# Patient Record
Sex: Female | Born: 2000 | Race: White | Hispanic: No | Marital: Single | Smoking: Former smoker
Health system: Southern US, Community
[De-identification: ages and names within clinical notes are randomized; demographics above are authoritative.]

## PROBLEM LIST (undated history)

## (undated) ENCOUNTER — Emergency Department (HOSPITAL_COMMUNITY): Admission: EM | Payer: Self-pay | Source: Home / Self Care

## (undated) DIAGNOSIS — F419 Anxiety disorder, unspecified: Secondary | ICD-10-CM

## (undated) DIAGNOSIS — F32A Depression, unspecified: Secondary | ICD-10-CM

## (undated) HISTORY — PX: WISDOM TOOTH EXTRACTION: SHX21

## (undated) HISTORY — DX: Depression, unspecified: F32.A

## (undated) HISTORY — DX: Anxiety disorder, unspecified: F41.9

## (undated) HISTORY — PX: TONSILLECTOMY: SUR1361

---
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
## 2021-11-15 ENCOUNTER — Ambulatory Visit: Payer: Self-pay | Admitting: Family Medicine

## 2021-11-28 NOTE — Progress Notes (Deleted)
Medical Nutrition Therapy ?Patient has completed 2nd dose of COVID-19 vaccine ***. ?Appt start time: 1430 end time: 1530 (1 hour) ?Primary concerns today:  Disordered eating beahaviors .  ? ?Relevant history/background: Megan Zamora was referred by Deforest Hoyles.   ? ?Assessment:  *** ? ?Learning Readiness:  ?Not ready ?Contemplating ?Ready ?Change in progress ? ?Usual eating pattern: *** meals and *** snacks per day. ?Frequent foods and beverages: ***.   ?Avoided foods: ***.   ?Usual physical activity: ***. ?Sleep: *** ? ?24-hr recall: ?(Up at  AM) ?B ( AM)-    ?Snk ( AM)-    ?L ( PM)-   ?Snk ( PM)-   ?D ( PM)-   ?Snk ( PM)-   ?Typical day? {yes no:314532}   ? ?Nutritional Diagnosis:  ?{CHL AMB NUTRITIONAL DIAGNOSIS:[id number]} ? ?Handouts given during visit include: ?After-Visit Summary (AVS) ?*** ? ?Demonstrated degree of understanding via:  Teach Back  ?Barriers to learning/adherence to lifestyle change: *** ? ?Monitoring/Evaluation:  Dietary intake, exercise, and body weight {follow up:15908}. ? ?

## 2021-11-29 ENCOUNTER — Ambulatory Visit: Payer: Self-pay | Admitting: Family Medicine

## 2022-10-15 ENCOUNTER — Ambulatory Visit (INDEPENDENT_AMBULATORY_CARE_PROVIDER_SITE_OTHER): Payer: Commercial Managed Care - PPO | Admitting: Physician Assistant

## 2022-10-15 ENCOUNTER — Encounter: Payer: Self-pay | Admitting: Physician Assistant

## 2022-10-15 VITALS — BP 127/81 | HR 84 | Ht 65.0 in | Wt 120.0 lb

## 2022-10-15 DIAGNOSIS — Z79899 Other long term (current) drug therapy: Secondary | ICD-10-CM

## 2022-10-15 DIAGNOSIS — F3162 Bipolar disorder, current episode mixed, moderate: Secondary | ICD-10-CM

## 2022-10-15 MED ORDER — LURASIDONE HCL 20 MG PO TABS: 20.0000 mg | ORAL_TABLET | Freq: Every day | ORAL | 1 refills | Status: DC

## 2022-10-15 NOTE — Patient Instructions (Signed)
Start taking a multivitamin, B Complex, Vitamin D 2000 IU daily and fish oil
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
## 2022-11-13 LAB — HEMOGLOBIN A1C
Est. average glucose Bld gHb Est-mCnc: 100 mg/dL
Hgb A1c MFr Bld: 5.1 % (ref 4.8–5.6)

## 2022-11-13 LAB — COMPREHENSIVE METABOLIC PANEL
ALT: 14 IU/L (ref 0–32)
AST: 15 IU/L (ref 0–40)
Albumin/Globulin Ratio: 1.9 (ref 1.2–2.2)
Albumin: 4.5 g/dL (ref 4.0–5.0)
Alkaline Phosphatase: 64 IU/L (ref 44–121)
BUN/Creatinine Ratio: 14 (ref 9–23)
BUN: 9 mg/dL (ref 6–20)
Bilirubin Total: 0.3 mg/dL (ref 0.0–1.2)
CO2: 22 mmol/L (ref 20–29)
Calcium: 9.3 mg/dL (ref 8.7–10.2)
Chloride: 103 mmol/L (ref 96–106)
Creatinine, Ser: 0.63 mg/dL (ref 0.57–1.00)
Globulin, Total: 2.4 g/dL (ref 1.5–4.5)
Glucose: 78 mg/dL (ref 70–99)
Potassium: 3.5 mmol/L (ref 3.5–5.2)
Sodium: 139 mmol/L (ref 134–144)
Total Protein: 6.9 g/dL (ref 6.0–8.5)
eGFR: 129 mL/min/{1.73_m2} (ref >59)

## 2022-11-13 LAB — LIPID PANEL
Chol/HDL Ratio: 1.8 ratio (ref 0.0–4.4)
Cholesterol, Total: 143 mg/dL (ref 100–199)
HDL: 81 mg/dL (ref >39)
LDL Chol Calc (NIH): 54 mg/dL (ref 0–99)
Triglycerides: 30 mg/dL (ref 0–149)
VLDL Cholesterol Cal: 8 mg/dL (ref 5–40)

## 2022-11-27 ENCOUNTER — Ambulatory Visit: Payer: Self-pay | Admitting: Physician Assistant

## 2022-12-25 ENCOUNTER — Ambulatory Visit (INDEPENDENT_AMBULATORY_CARE_PROVIDER_SITE_OTHER): Payer: Self-pay | Admitting: Physician Assistant

## 2022-12-25 ENCOUNTER — Encounter: Payer: Self-pay | Admitting: Physician Assistant

## 2022-12-25 ENCOUNTER — Ambulatory Visit: Payer: Self-pay | Admitting: Physician Assistant

## 2022-12-25 DIAGNOSIS — F3162 Bipolar disorder, current episode mixed, moderate: Secondary | ICD-10-CM

## 2022-12-25 MED ORDER — LURASIDONE HCL 40 MG PO TABS: 40.0000 mg | ORAL_TABLET | Freq: Every day | ORAL | 1 refills | Status: DC

## 2022-12-25 NOTE — Progress Notes (Signed)
[facility] Med Check  Patient ID: Megan Zamora,  MRN: [id number]  PCP: Pcp, No  Date of Evaluation: 12/25/2022 Time spent:20 minutes  Chief Complaint:  Chief Complaint   Follow-up    HISTORY/CURRENT STATUS: HPI For routine med check  Started Latuda 2 months ago. Still has mood swings but the manic episodes are not as intense as they were.  She still feels kind of down, is sensitive easily and can cry easily for no reason.  There are times when she lacks energy but it is not affecting her at work or at home.  ADLs and personal hygiene are normal.  Appetite is normal.  She has gained a couple of pounds since being on the [place] but she does not see it as a problem as she is underweight anyway.  She is looking forward to going to [place] on a road trip leaving in 2 weeks.  She will be rock climbing, hiking, and spending time with friends.  Not having a lot of anxiety.  She sleeps well.  No suicidal or homicidal thoughts.  Patient denies increased energy with decreased need for sleep, increased talkativeness, racing thoughts, impulsivity or risky behaviors, increased spending, increased libido, grandiosity, increased irritability or anger, paranoia, or hallucinations.  Denies dizziness, syncope, seizures, numbness, tingling, tremor, tics, unsteady gait, slurred speech, confusion. Denies muscle or joint pain, stiffness, or dystonia. Denies unexplained weight loss, frequent infections, or sores that heal slowly.  No polyphagia, polydipsia, or polyuria. Denies visual changes or paresthesias.   Individual Medical History/ Review of Systems: Changes? :No   Past medications for mental health diagnoses include: Lexapro   Saw Psych at [place] [facility]   No Suicide attempts, H/O cutting, last time was 07/2022.  Has not had to seek medical attention for any of the lacerations.   Sees Carney Bern since she was 22 years old  Allergies: Patient has no known  allergies.  Current Medications:  Current Outpatient Medications:    b complex vitamins capsule, Take 1 capsule by mouth daily., Disp: , Rfl:    lurasidone (LATUDA) 40 MG TABS tablet, Take 1 tablet (40 mg total) by mouth daily with supper., Disp: 30 tablet, Rfl: 1   valACYclovir (VALTREX) 500 MG tablet, Take 500 mg by mouth 2 (two) times daily. prn, Disp: , Rfl:  Medication Side Effects: none  Family Medical/ Social History: Changes? No  MENTAL HEALTH EXAM:  There were no vitals taken for this visit.There is no height or weight on file to calculate BMI.  General Appearance: Casual and Well Groomed  Eye Contact:  Good  Speech:  Clear and Coherent and Normal Rate  Volume:  Normal  Mood:  Euthymic  Affect:  Congruent  Thought Process:  Goal Directed and Descriptions of Associations: Circumstantial  Orientation:  Full (Time, Place, and Person)  Thought Content: Logical   Suicidal Thoughts:  No  Homicidal Thoughts:  No  Memory:  WNL  Judgement:  Good  Insight:  Good  Psychomotor Activity:  Normal  Concentration:  Concentration: Good  Recall:  Good  Fund of Knowledge: Good  Language: Good  Assets:  Desire for Improvement Financial Resources/Insurance Housing Transportation Vocational/Educational  ADL's:  Intact  Cognition: WNL  Prognosis:  Good   Labs 11/12/2022 CMP glucose 78, all other values normal Total cholesterol 143, triglycerides 30, HDL 81, LDL 54 Hemoglobin A1c 5.1  DIAGNOSES:    ICD-10-CM   1. Bipolar 1 disorder, mixed, moderate (HCC)  F31.62  Receiving Psychotherapy: Yes with Carney Bern.  RECOMMENDATIONS:  PDMP reviewed.  Tramadol given 07/24/2021. I provided 20 minutes of face to face time during this encounter, including time spent before and after the visit in records review, medical decision making, counseling pertinent to today's visit, and charting.   She is responding to the [facility] but the dose is too low so will increase  that.  Increase Latuda to 40 mg, 1 p.o. q. evening with supper. Continue B complex. Continue therapy with Carney Bern. Return in 6 weeks.  Donnal Moat, PA-C

## 2023-01-06 ENCOUNTER — Telehealth: Payer: Self-pay | Admitting: Physician Assistant

## 2023-01-06 NOTE — Telephone Encounter (Signed)
Megan Zamora called at 2:00 to request a call back.  She is having issues with her Latuda and would like to discuss the medication.  Next appt 5/7

## 2023-01-07 ENCOUNTER — Other Ambulatory Visit: Payer: Self-pay | Admitting: Physician Assistant

## 2023-01-07 NOTE — Telephone Encounter (Signed)
Patient called to say that when she increased the Latuda to 40 mg she began having visual hallucinations, seeing things that weren't there. She also reports "dizziness" that feels like things are spinning. She reports that mood swings seem better though. Last visit was 3/27, which is when the Latuda was increased.   Pharmacy is [facility] on [address].

## 2023-01-08 ENCOUNTER — Other Ambulatory Visit: Payer: Self-pay | Admitting: Physician Assistant

## 2023-01-08 MED ORDER — LURASIDONE HCL 20 MG PO TABS: 20.0000 mg | ORAL_TABLET | Freq: Every day | ORAL | 1 refills | Status: DC

## 2023-01-08 NOTE — Telephone Encounter (Signed)
LVM to RC 

## 2023-01-08 NOTE — Telephone Encounter (Signed)
Patient notified of Rx and recommendations.  

## 2023-01-08 NOTE — Telephone Encounter (Signed)
I think she's leaving soon to go on a road trip to [place] to rock climb. I don't want to make major changes now. (Like trying something completely new.)  Let's go back to 20 mg on the Latuda, and we'll discuss further.at our next visit.  I sent in a prescription for Latuda 20 mg.

## 2023-02-04 ENCOUNTER — Ambulatory Visit (INDEPENDENT_AMBULATORY_CARE_PROVIDER_SITE_OTHER): Payer: Commercial Managed Care - PPO | Admitting: Physician Assistant

## 2023-02-04 ENCOUNTER — Encounter: Payer: Self-pay | Admitting: Physician Assistant

## 2023-02-04 DIAGNOSIS — F3162 Bipolar disorder, current episode mixed, moderate: Secondary | ICD-10-CM | POA: Diagnosis not present

## 2023-02-04 DIAGNOSIS — R4584 Anhedonia: Secondary | ICD-10-CM | POA: Diagnosis not present

## 2023-02-04 MED ORDER — LURASIDONE HCL 20 MG PO TABS: 20.0000 mg | ORAL_TABLET | Freq: Every day | ORAL | 1 refills | Status: DC

## 2023-02-04 NOTE — Progress Notes (Signed)
[facility] Med Check  Patient ID: Laneisha Holderbaum,  MRN: [id number]  PCP: Pcp, No  Date of Evaluation: 02/04/2023 Time spent:20 minutes  Chief Complaint:  Chief Complaint   Follow-up    HISTORY/CURRENT STATUS: HPI For routine med check, 15 mins late for appt.  We increased Latuda to 40 mg about 6 weeks ago.  She states it made her feel bad, caused nausea and dizziness so she stopped it altogether.  States she just had her menstrual cycle and is not pregnant.  Prior to that she had been on 20 mg and had responded well.  States she also felt kind of "numb" and did not want to do anything that she normally liked.  She felt sad, did not cry easily though.  After she stopped the 40 mg of Latuda her mood went back to normal, the dizziness and nausea went away.  Appetite and weight are stable.  No laxative use, binging or purging, or calorie restricting.  No self-harm.  Personal hygiene is normal.  Work is fine.  Denies anxiety.  No suicidal or homicidal thoughts.  Patient denies increased energy with decreased need for sleep, increased talkativeness, racing thoughts, impulsivity or risky behaviors, increased spending, increased libido, grandiosity, increased irritability or anger, paranoia, or hallucinations.  Denies dizziness, syncope, seizures, numbness, tingling, tremor, tics, unsteady gait, slurred speech, confusion. Denies muscle or joint pain, stiffness, or dystonia. Denies unexplained weight loss, frequent infections, or sores that heal slowly.  No polyphagia, polydipsia, or polyuria. Denies visual changes or paresthesias.   Individual Medical History/ Review of Systems: Changes? :No   Past medications for mental health diagnoses include: Lexapro   [facility]   No Suicide attempts, H/O cutting, last time was 07/2022.  Has not had to seek medical attention for any of the lacerations.   Sees Burney Gauze since she was 22 years old  Allergies:  Patient has no known allergies.  Current Medications:  Current Outpatient Medications:    b complex vitamins capsule, Take 1 capsule by mouth daily., Disp: , Rfl:    valACYclovir (VALTREX) 500 MG tablet, Take 500 mg by mouth 2 (two) times daily. prn, Disp: , Rfl:    lurasidone (LATUDA) 20 MG TABS tablet, Take 1 tablet (20 mg total) by mouth daily with supper., Disp: 30 tablet, Rfl: 1 Medication Side Effects: none  Family Medical/ Social History: Changes? No  MENTAL HEALTH EXAM:  There were no vitals taken for this visit.There is no height or weight on file to calculate BMI.  General Appearance: Casual and Well Groomed  Eye Contact:  Good  Speech:  Clear and Coherent and Normal Rate  Volume:  Normal  Mood:  Euthymic  Affect:  Congruent  Thought Process:  Goal Directed and Descriptions of Associations: Circumstantial  Orientation:  Full (Time, Place, and Person)  Thought Content: Logical   Suicidal Thoughts:  No  Homicidal Thoughts:  No  Memory:  WNL  Judgement:  Good  Insight:  Good  Psychomotor Activity:  Normal  Concentration:  Concentration: Good and Attention Span: Good  Recall:  Good  Fund of Knowledge: Good  Language: Good  Assets:  Desire for Improvement Financial Resources/Insurance Housing Transportation Vocational/Educational  ADL's:  Intact  Cognition: WNL  Prognosis:  Good   DIAGNOSES:    ICD-10-CM   1. Bipolar 1 disorder, mixed, moderate (HCC)  F31.62     2. Anhedonia  R45.84      Receiving Psychotherapy: Yes with  Burney Gauze.  RECOMMENDATIONS:  PDMP reviewed.  Tramadol given 07/24/2021. I provided 20 minutes of face to face time during this encounter, including time spent before and after the visit in records review, medical decision making, counseling pertinent to today's visit, and charting.   Discussed the dizziness and nausea.  Even though she just finished menses, if the dizziness and nausea recurs she needs to do a pregnancy test.  She  verbalizes understanding.   Reminded her that anhedonia is a symptom of depression, it is difficult to say if what she experienced was related to the Latuda or depression.  She tolerated the 20 mg however and did not experience anhedonia.  I recommend restarting that to help regulate symptoms of bipolar disorder.  She agrees.  Restart Latuda 20 mg, 1 p.o. q. evening with supper. Continue B complex.  Recommend vitamin D and multivitamin as well. Continue therapy with Burney Gauze. Return in 4-6 weeks.  Melony Overly, PA-C

## 2023-03-19 ENCOUNTER — Ambulatory Visit (INDEPENDENT_AMBULATORY_CARE_PROVIDER_SITE_OTHER): Payer: Self-pay | Admitting: Physician Assistant

## 2023-04-04 ENCOUNTER — Encounter (HOSPITAL_COMMUNITY): Payer: Self-pay

## 2023-04-04 ENCOUNTER — Ambulatory Visit (HOSPITAL_COMMUNITY)
Admission: EM | Admit: 2023-04-04 | Discharge: 2023-04-04 | Disposition: A | Discharge status: Home / Self Care | Payer: Commercial Managed Care - PPO | Attending: Emergency Medicine | Admitting: Emergency Medicine

## 2023-04-04 DIAGNOSIS — R0789 Other chest pain: Secondary | ICD-10-CM | POA: Insufficient documentation

## 2023-04-04 LAB — CBC WITH DIFFERENTIAL/PLATELET
Abs Immature Granulocytes: 0.04 10*3/uL (ref 0.00–0.07)
Basophils Absolute: 0.1 10*3/uL (ref 0.0–0.1)
Basophils Relative: 1 %
Eosinophils Absolute: 0.1 10*3/uL (ref 0.0–0.5)
Eosinophils Relative: 2 %
HCT: 41 % (ref 36.0–46.0)
Hemoglobin: 13.1 g/dL (ref 12.0–15.0)
Immature Granulocytes: 1 %
Lymphocytes Relative: 41 %
Lymphs Abs: 2.9 10*3/uL (ref 0.7–4.0)
MCH: 28.9 pg (ref 26.0–34.0)
MCHC: 32 g/dL (ref 30.0–36.0)
MCV: 90.5 fL (ref 80.0–100.0)
Monocytes Absolute: 0.7 10*3/uL (ref 0.1–1.0)
Monocytes Relative: 10 %
Neutro Abs: 3.2 10*3/uL (ref 1.7–7.7)
Neutrophils Relative %: 45 %
Platelets: 271 10*3/uL (ref 150–400)
RBC: 4.53 MIL/uL (ref 3.87–5.11)
RDW: 13 % (ref 11.5–15.5)
WBC: 7.1 10*3/uL (ref 4.0–10.5)
nRBC: 0 % (ref 0.0–0.2)

## 2023-04-04 LAB — COMPREHENSIVE METABOLIC PANEL
ALT: 13 U/L (ref 0–44)
AST: 21 U/L (ref 15–41)
Albumin: 4.1 g/dL (ref 3.5–5.0)
Alkaline Phosphatase: 66 U/L (ref 38–126)
Anion gap: 8 (ref 5–15)
BUN: 9 mg/dL (ref 6–20)
CO2: 26 mmol/L (ref 22–32)
Calcium: 9.3 mg/dL (ref 8.9–10.3)
Chloride: 107 mmol/L (ref 98–111)
Creatinine, Ser: 0.72 mg/dL (ref 0.44–1.00)
GFR, Estimated: >60 mL/min (ref >60)
Glucose, Bld: 50 mg/dL — ABNORMAL LOW (ref 70–99)
Potassium: 3.7 mmol/L (ref 3.5–5.1)
Sodium: 141 mmol/L (ref 135–145)
Total Bilirubin: 0.9 mg/dL (ref 0.3–1.2)
Total Protein: 6.9 g/dL (ref 6.5–8.1)

## 2023-04-04 NOTE — ED Triage Notes (Addendum)
No current chest pain. Sharp chest pain onset yesterday and lasted for 3 hours. Pain was in the left side of the chest into the right shoulder and mid chest.   Pain with movement of the left arm, Patient states during the chest pain episode could not mover or take a deep breath. HR was 130-140.   No h/o heart problems. Has a h/o muscle spasms but this felt different.   No medication changes. Patient states had some alcohol, no drugs. Had drank a lot of water too.

## 2023-04-04 NOTE — ED Provider Notes (Signed)
[facility]    CSN: [id number] Arrival date & time: 04/04/23  1307      History   Chief Complaint Chief Complaint  Patient presents with   Chest Pain    HPI Megan Zamora is a 22 y.o. adult.   Patient presents to clinic over concern for an episode of chest pain and tachycardia that started last night while she was eating at [employer].  Initially her chest pain was mild, and got worse over the span of a few hours.  Reports she felt shortness of breath and like she was really overheated so she sat in front of the [facility] unit.  Her boyfriend is an EMT and he checked her heart rate, it was in the 130s to the 140s.  Reports similar episode of something occurring about a year ago, this lasted for 45 minutes.  She did have 2 mixed drinks earlier in the day.  Denies smoking or any drug use.  She has a history of bipolar 1, anxiety and depression, came off Latuda in March and is not on any daily medications at this time.  No current chest pain, shortness of breath or tachycardia.    The history is provided by the patient and medical records.  Chest Pain Associated symptoms: shortness of breath     Past Medical History:  Diagnosis Date   Anxiety    Depression     There are no problems to display for this patient.   Past Surgical History:  Procedure Laterality Date   TONSILLECTOMY     WISDOM TOOTH EXTRACTION      OB History   No obstetric history on file.      Home Medications    Prior to Admission medications   Not on File    Family History Family History  Problem Relation Age of Onset   Bipolar disorder Mother    Anxiety disorder Mother    Alcohol abuse Father    Sleep apnea Father    Healthy Sister    Leukemia Maternal Grandfather    Heart disease Maternal Grandfather    Sleep apnea Maternal Grandmother    Hypertension Maternal Grandmother    Myelodysplastic syndrome Paternal Grandmother     Social History Social History   Tobacco Use    Smoking status: Every Day    Types: E-cigarettes   Smokeless tobacco: Never  Vaping Use   Vaping Use: Former  Substance Use Topics   Alcohol use: Yes    Alcohol/week: 1.0 standard drink of alcohol    Types: 1 Glasses of wine per week   Drug use: Yes    Frequency: 7.0 times per week    Types: Marijuana     Allergies   Patient has no known allergies.   Review of Systems Review of Systems  Respiratory:  Positive for shortness of breath.   Cardiovascular:  Positive for chest pain.     Physical Exam Triage Vital Signs ED Triage Vitals  Enc Vitals Group     BP 04/04/23 1342 (!) 93/56     Pulse Rate 04/04/23 1342 65     Resp 04/04/23 1342 16     Temp 04/04/23 1342 98.9 F (37.2 C)     Temp Source 04/04/23 1342 Oral     SpO2 04/04/23 1342 99 %     Weight 04/04/23 1341 120 lb (54.4 kg)     Height 04/04/23 1341 5\' 5"  (1.651 m)     Head Circumference --  Peak Flow --      Pain Score 04/04/23 1340 8     Pain Loc --      Pain Edu? --      Excl. in GC? --    No data found.  Updated Vital Signs BP (!) 93/56 (BP Location: Left Arm)   Pulse 65   Temp 98.9 F (37.2 C) (Oral)   Resp 16   Ht 5\' 5"  (1.651 m)   Wt 120 lb (54.4 kg)   LMP 02/27/2023 (Approximate)   SpO2 99%   BMI 19.97 kg/m   Visual Acuity Right Eye Distance:   Left Eye Distance:   Bilateral Distance:    Right Eye Near:   Left Eye Near:    Bilateral Near:     Physical Exam Vitals and nursing note reviewed.  Constitutional:      Appearance: She is well-developed.  HENT:     Head: Normocephalic and atraumatic.  Cardiovascular:     Rate and Rhythm: Normal rate and regular rhythm.     Heart sounds: Normal heart sounds. No murmur heard. Pulmonary:     Effort: Pulmonary effort is normal. No tachypnea.     Breath sounds: Normal breath sounds. No decreased breath sounds.  Skin:    General: Skin is warm and dry.  Neurological:     General: No focal deficit present.     Mental Status: She is  alert and oriented to person, place, and time.  Psychiatric:        Mood and Affect: Mood normal.      UC Treatments / Results  Labs (all labs ordered are listed, but only abnormal results are displayed) Labs Reviewed  COMPREHENSIVE METABOLIC PANEL  CBC WITH DIFFERENTIAL/PLATELET    EKG   Radiology No results found.  Procedures Procedures (including critical care time)  Medications Ordered in UC Medications - No data to display  Initial Impression / Assessment and Plan / UC Course  I have reviewed the triage vital signs and the nursing notes.  Pertinent labs & imaging results that were available during my care of the patient were reviewed by me and considered in my medical decision making (see chart for details).  Vitals and triage reviewed, patient is hemodynamically stable.  Without tachycardia on physical exam.  EKG shows normal sinus rhythm at a rate of 67 bpm, without ST elevation or ST depression.  No current chest pain or shortness of breath.  Unclear etiology to chest pain and tachycardia, could be dehydration, heat exposure, or other numerous causes.  Basic labs obtained in clinic, will contact if there is anything emergent.  Encouraged to follow-up with primary care provider for further evaluation.  Strict emergency room return precautions given if episode returns, patient verbalized understanding, no questions at this time.     Final Clinical Impressions(s) / UC Diagnoses   Final diagnoses:  Atypical chest pain     Discharge Instructions      Overall your physical exam is reassuring today.  Your EKG showed normal sinus rhythm at a rate of 67 bpm.  We are going to obtain some lab work to look for any electrolyte or metabolic abnormalities, we will contact you if there is anything emergent regarding this.  Please follow-up with your primary care provider for further evaluation and workup.    Please seek immediate care if you develop loss of consciousness,  continued elevated heart rate, or any new concerning symptoms.     ED Prescriptions  None    PDMP not reviewed this encounter.   Jon Lall, [place], [place] 04/04/23 [phone]

## 2023-04-04 NOTE — Discharge Instructions (Addendum)
Overall your physical exam is reassuring today.  Your EKG showed normal sinus rhythm at a rate of 67 bpm.  We are going to obtain some lab work to look for any electrolyte or metabolic abnormalities, we will contact you if there is anything emergent regarding this.  Please follow-up with your primary care provider for further evaluation and workup.    Please seek immediate care if you develop loss of consciousness, continued elevated heart rate, or any new concerning symptoms.

## 2023-04-18 ENCOUNTER — Ambulatory Visit (INDEPENDENT_AMBULATORY_CARE_PROVIDER_SITE_OTHER): Payer: Self-pay | Admitting: Physician Assistant

## 2023-04-18 DIAGNOSIS — Z91199 Patient's noncompliance with other medical treatment and regimen due to unspecified reason: Secondary | ICD-10-CM

## 2023-04-28 NOTE — Progress Notes (Signed)
No show

## 2023-05-21 ENCOUNTER — Ambulatory Visit: Payer: Commercial Managed Care - PPO | Admitting: Physician Assistant

## 2023-06-23 LAB — OB RESULTS CONSOLE RUBELLA ANTIBODY, IGM: Rubella: IMMUNE

## 2023-06-23 LAB — OB RESULTS CONSOLE GC/CHLAMYDIA
Chlamydia: NEGATIVE
Neisseria Gonorrhea: NEGATIVE

## 2023-06-23 LAB — OB RESULTS CONSOLE HIV ANTIBODY (ROUTINE TESTING): HIV: NONREACTIVE

## 2023-06-23 LAB — OB RESULTS CONSOLE HEPATITIS B SURFACE ANTIGEN: Hepatitis B Surface Ag: NEGATIVE

## 2023-06-23 LAB — OB RESULTS CONSOLE RPR: RPR: NONREACTIVE

## 2023-10-01 NOTE — L&D Delivery Note (Signed)
 Operative Delivery Note At 11:51 PM a viable female was delivered via Vaginal, Spontaneous.  Presentation: vertex; Position: Left,, Occiput,, Anterior; Station: +3. Patient pushed for almost 2 hours. Became exhausted. FHT with decreasing variability and decel with every contraction. Requested assistance with delivery. D/w pt options including continue to push, CS, or OVD. Elected OVD.   Verbal consent: obtained from patient.  Risks and benefits discussed in detail.  Risks include, but are not limited to the risks of anesthesia, bleeding, infection, damage to maternal tissues, fetal cephalhematoma.  There is also the risk of inability to effect vaginal delivery of the head, or shoulder dystocia that cannot be resolved by established maneuvers, leading to the need for emergency cesarean section.  Easy delivery of head followed by shoulders with gentle downward traction.  APGAR: 8, 9; weight pending .   Placenta status: routine, .   Cord:  with the following complications: .  Cord pH: not sent  Anesthesia:  CLE Instruments: Kiwi Episiotomy: None Lacerations: 2nd degree;Vaginal Suture Repair: 3.0 vicryl Est. Blood Loss (mL):  408cc   It's a boy - "Megan Zamora"!!  Mom to postpartum.  Baby to Couplet care / Skin to Skin.  Kendrick Pax Wileen Duncanson 02/04/2024, 12:16 AM

## 2023-10-30 ENCOUNTER — Telehealth: Payer: Self-pay | Admitting: Physician Assistant

## 2023-10-30 NOTE — Telephone Encounter (Signed)
LVM TO RC

## 2023-10-30 NOTE — Telephone Encounter (Signed)
Megan Zamora called stating that she is pregnant and needs a RX filled but says it can't be Lorsartin because it gave her side effects. Her phone number is [phone].  I've informed patient that she needs to make an appointment to discuss with Rosey Bath about the medication and being pregnant.

## 2023-10-31 NOTE — Telephone Encounter (Signed)
Please schedule pt an appt. To discuss meds and pregnancy.   Pt is not taking Latuda dt hallucinations.

## 2023-11-03 NOTE — Telephone Encounter (Signed)
 Lvm for pt to call and schedule

## 2023-11-13 LAB — OB RESULTS CONSOLE RPR: RPR: NONREACTIVE

## 2023-11-13 LAB — OB RESULTS CONSOLE HIV ANTIBODY (ROUTINE TESTING): HIV: NONREACTIVE

## 2023-11-13 LAB — OB RESULTS CONSOLE GC/CHLAMYDIA
Chlamydia: NEGATIVE
Neisseria Gonorrhea: NEGATIVE

## 2024-01-08 LAB — OB RESULTS CONSOLE GBS: GBS: NEGATIVE

## 2024-01-14 ENCOUNTER — Ambulatory Visit (INDEPENDENT_AMBULATORY_CARE_PROVIDER_SITE_OTHER): Payer: Commercial Managed Care - PPO | Admitting: Physician Assistant

## 2024-01-14 DIAGNOSIS — Z91199 Patient's noncompliance with other medical treatment and regimen due to unspecified reason: Secondary | ICD-10-CM

## 2024-01-14 NOTE — Progress Notes (Signed)
 No show

## 2024-02-03 ENCOUNTER — Encounter (HOSPITAL_COMMUNITY): Payer: Self-pay | Admitting: *Deleted

## 2024-02-03 ENCOUNTER — Inpatient Hospital Stay (HOSPITAL_COMMUNITY): Admitting: Anesthesiology

## 2024-02-03 ENCOUNTER — Inpatient Hospital Stay (HOSPITAL_COMMUNITY)
Admission: AD | Admit: 2024-02-03 | Discharge: 2024-02-05 | DRG: 807 | Disposition: A | Discharge status: Home / Self Care | Attending: Obstetrics and Gynecology | Admitting: Obstetrics and Gynecology

## 2024-02-03 DIAGNOSIS — O9902 Anemia complicating childbirth: Secondary | ICD-10-CM | POA: Diagnosis present

## 2024-02-03 DIAGNOSIS — Z87891 Personal history of nicotine dependence: Secondary | ICD-10-CM

## 2024-02-03 DIAGNOSIS — O26893 Other specified pregnancy related conditions, third trimester: Secondary | ICD-10-CM | POA: Diagnosis present

## 2024-02-03 DIAGNOSIS — L0292 Furuncle, unspecified: Secondary | ICD-10-CM | POA: Diagnosis present

## 2024-02-03 DIAGNOSIS — Z3490 Encounter for supervision of normal pregnancy, unspecified, unspecified trimester: Principal | ICD-10-CM

## 2024-02-03 DIAGNOSIS — O9832 Other infections with a predominantly sexual mode of transmission complicating childbirth: Secondary | ICD-10-CM | POA: Diagnosis present

## 2024-02-03 DIAGNOSIS — Z3A40 40 weeks gestation of pregnancy: Secondary | ICD-10-CM

## 2024-02-03 DIAGNOSIS — A6000 Herpesviral infection of urogenital system, unspecified: Secondary | ICD-10-CM | POA: Diagnosis present

## 2024-02-03 DIAGNOSIS — O9972 Diseases of the skin and subcutaneous tissue complicating childbirth: Secondary | ICD-10-CM | POA: Diagnosis present

## 2024-02-03 LAB — CBC
HCT: 37.4 % (ref 36.0–46.0)
Hemoglobin: 12 g/dL (ref 12.0–15.0)
MCH: 27 pg (ref 26.0–34.0)
MCHC: 32.1 g/dL (ref 30.0–36.0)
MCV: 84 fL (ref 80.0–100.0)
Platelets: 290 10*3/uL (ref 150–400)
RBC: 4.45 MIL/uL (ref 3.87–5.11)
RDW: 14.2 % (ref 11.5–15.5)
WBC: 11.1 10*3/uL — ABNORMAL HIGH (ref 4.0–10.5)
nRBC: 0 % (ref 0.0–0.2)

## 2024-02-03 LAB — HIV ANTIBODY (ROUTINE TESTING W REFLEX): HIV Screen 4th Generation wRfx: NONREACTIVE

## 2024-02-03 LAB — POCT FERN TEST
POCT Fern Test: NEGATIVE
POCT Fern Test: POSITIVE

## 2024-02-03 LAB — TYPE AND SCREEN
ABO/RH(D): A POS
Antibody Screen: NEGATIVE

## 2024-02-03 LAB — COMPREHENSIVE METABOLIC PANEL WITH GFR
ALT: 12 U/L (ref 0–44)
AST: 20 U/L (ref 15–41)
Albumin: 2.9 g/dL — ABNORMAL LOW (ref 3.5–5.0)
Alkaline Phosphatase: 159 U/L — ABNORMAL HIGH (ref 38–126)
Anion gap: 13 (ref 5–15)
BUN: 8 mg/dL (ref 6–20)
CO2: 19 mmol/L — ABNORMAL LOW (ref 22–32)
Calcium: 9 mg/dL (ref 8.9–10.3)
Chloride: 104 mmol/L (ref 98–111)
Creatinine, Ser: 0.68 mg/dL (ref 0.44–1.00)
GFR, Estimated: >60 mL/min (ref >60)
Glucose, Bld: 99 mg/dL (ref 70–99)
Potassium: 3.6 mmol/L (ref 3.5–5.1)
Sodium: 136 mmol/L (ref 135–145)
Total Bilirubin: 0.4 mg/dL (ref 0.0–1.2)
Total Protein: 6.2 g/dL — ABNORMAL LOW (ref 6.5–8.1)

## 2024-02-03 LAB — PROTEIN / CREATININE RATIO, URINE
Creatinine, Urine: 80 mg/dL
Protein Creatinine Ratio: 0.08 mg/mg{creat} (ref 0.00–0.15)
Total Protein, Urine: 6 mg/dL

## 2024-02-03 MED ORDER — TRANEXAMIC ACID-NACL 1000-0.7 MG/100ML-% IV SOLN
1000.0000 mg | INTRAVENOUS | Status: DC
Start: 2024-02-04 — End: 2024-02-05

## 2024-02-03 MED ORDER — EPHEDRINE 5 MG/ML INJ: 10.0000 mg | INTRAVENOUS | Status: DC | PRN

## 2024-02-03 MED ORDER — PHENYLEPHRINE 80 MCG/ML (10ML) SYRINGE FOR IV PUSH (FOR BLOOD PRESSURE SUPPORT)
80.0000 ug | PREFILLED_SYRINGE | INTRAVENOUS | Status: DC | PRN
Start: 2024-02-03 — End: 2024-02-03

## 2024-02-03 MED ORDER — ONDANSETRON HCL 4 MG/2ML IJ SOLN: 4.0000 mg | Freq: Four times a day (QID) | INTRAMUSCULAR | Status: DC | PRN

## 2024-02-03 MED ORDER — HYDROCORTISONE 1 % EX CREA: TOPICAL_CREAM | Freq: Three times a day (TID) | CUTANEOUS | Status: DC | PRN

## 2024-02-03 MED ORDER — PHENYLEPHRINE 80 MCG/ML (10ML) SYRINGE FOR IV PUSH (FOR BLOOD PRESSURE SUPPORT): 80.0000 ug | PREFILLED_SYRINGE | INTRAVENOUS | Status: DC | PRN

## 2024-02-03 MED ORDER — LACTATED RINGERS IV SOLN: 500.0000 mL | Freq: Once | INTRAVENOUS | Status: DC

## 2024-02-03 MED ORDER — ACETAMINOPHEN 325 MG PO TABS: 650.0000 mg | ORAL_TABLET | ORAL | Status: DC | PRN

## 2024-02-03 MED ORDER — LACTATED RINGERS IV SOLN: 500.0000 mL | INTRAVENOUS | Status: DC | PRN

## 2024-02-03 MED ORDER — HYDROXYZINE HCL 25 MG PO TABS
50.0000 mg | ORAL_TABLET | Freq: Four times a day (QID) | ORAL | Status: DC | PRN
  Administered 2024-02-04 – 2024-02-05 (×4): 50 mg via ORAL
  Filled 2024-02-03 (×4): qty 2

## 2024-02-03 MED ORDER — EPHEDRINE 5 MG/ML INJ
10.0000 mg | INTRAVENOUS | Status: DC | PRN
  Filled 2024-02-03: qty 5

## 2024-02-03 MED ORDER — FENTANYL-BUPIVACAINE-NACL 0.5-0.125-0.9 MG/250ML-% EP SOLN
12.0000 mL/h | EPIDURAL | Status: DC | PRN
  Administered 2024-02-03: 12 mL/h via EPIDURAL
  Filled 2024-02-03: qty 250

## 2024-02-03 MED ORDER — FENTANYL-BUPIVACAINE-NACL 0.5-0.125-0.9 MG/250ML-% EP SOLN: 12.0000 mL/h | EPIDURAL | Status: DC | PRN

## 2024-02-03 MED ORDER — LIDOCAINE HCL (PF) 1 % IJ SOLN: 30.0000 mL | INTRAMUSCULAR | Status: DC | PRN

## 2024-02-03 MED ORDER — LIDOCAINE-EPINEPHRINE (PF) 2 %-1:200000 IJ SOLN
INTRAMUSCULAR | Status: DC | PRN
  Administered 2024-02-03: 3 mL via EPIDURAL

## 2024-02-03 MED ORDER — OXYTOCIN BOLUS FROM INFUSION
333.0000 mL | Freq: Once | INTRAVENOUS | Status: AC
  Administered 2024-02-03: 333 mL via INTRAVENOUS

## 2024-02-03 MED ORDER — FENTANYL CITRATE (PF) 100 MCG/2ML IJ SOLN: 50.0000 ug | INTRAMUSCULAR | Status: DC | PRN

## 2024-02-03 MED ORDER — OXYTOCIN-SODIUM CHLORIDE 30-0.9 UT/500ML-% IV SOLN
1.0000 m[IU]/min | INTRAVENOUS | Status: DC
  Administered 2024-02-03: 2 m[IU]/min via INTRAVENOUS
  Filled 2024-02-03: qty 500

## 2024-02-03 MED ORDER — OXYCODONE-ACETAMINOPHEN 5-325 MG PO TABS: 1.0000 | ORAL_TABLET | ORAL | Status: DC | PRN

## 2024-02-03 MED ORDER — DIPHENHYDRAMINE HCL 50 MG/ML IJ SOLN: 12.5000 mg | INTRAMUSCULAR | Status: DC | PRN

## 2024-02-03 MED ORDER — TERBUTALINE SULFATE 1 MG/ML IJ SOLN
0.2500 mg | Freq: Once | INTRAMUSCULAR | Status: DC | PRN
  Filled 2024-02-03: qty 1

## 2024-02-03 MED ORDER — SOD CITRATE-CITRIC ACID 500-334 MG/5ML PO SOLN: 30.0000 mL | ORAL | Status: DC | PRN

## 2024-02-03 MED ORDER — OXYTOCIN-SODIUM CHLORIDE 30-0.9 UT/500ML-% IV SOLN
2.5000 [IU]/h | INTRAVENOUS | Status: DC
  Administered 2024-02-04: 2.5 [IU]/h via INTRAVENOUS

## 2024-02-03 MED ORDER — PHENYLEPHRINE 80 MCG/ML (10ML) SYRINGE FOR IV PUSH (FOR BLOOD PRESSURE SUPPORT)
80.0000 ug | PREFILLED_SYRINGE | INTRAVENOUS | Status: DC | PRN
  Filled 2024-02-03: qty 10

## 2024-02-03 MED ORDER — LACTATED RINGERS IV SOLN
500.0000 mL | Freq: Once | INTRAVENOUS | Status: AC
  Administered 2024-02-03: 500 mL via INTRAVENOUS

## 2024-02-03 MED ORDER — TRANEXAMIC ACID-NACL 1000-0.7 MG/100ML-% IV SOLN
INTRAVENOUS | Status: AC
  Administered 2024-02-03: 1000 mg
  Filled 2024-02-03: qty 100

## 2024-02-03 MED ORDER — LORATADINE 10 MG PO TABS
10.0000 mg | ORAL_TABLET | Freq: Every day | ORAL | Status: DC
  Administered 2024-02-03: 10 mg via ORAL
  Filled 2024-02-03: qty 1

## 2024-02-03 MED ORDER — LACTATED RINGERS IV SOLN: INTRAVENOUS | Status: DC

## 2024-02-03 MED ORDER — OXYCODONE-ACETAMINOPHEN 5-325 MG PO TABS: 2.0000 | ORAL_TABLET | ORAL | Status: DC | PRN

## 2024-02-03 NOTE — MAU Provider Note (Addendum)
  [name] is a 23 y.o. G1P0 patient who presents to MAU today with complaint of LOF since 0800 and  q 2 since 0800. RN Labor evaluation unable to clearly identify if patient ruptured with blind swabs requesting SSE. Patient was advised to be on Valtrex starting at 36 weeks but reported she has never had a genital breakout and has therefore not taken the medication. " I never picked up the RX" . Patient  states she was seen by her primary OB for a small ingrown hair on the left vulva  O BP 135/84   Pulse 88   Temp 98.1 F (36.7 C) (Oral)   Resp 17   Ht 5\' 5"  (1.651 m)   Wt 82.4 kg   LMP 02/27/2023 (Approximate)   SpO2 100%   BMI 30.24 kg/m  Physical Exam Vitals and nursing note reviewed. Exam conducted with a chaperone present.  HENT:     Head: Normocephalic.     Nose: Nose normal.     Mouth/Throat:     Mouth: Mucous membranes are moist.  Cardiovascular:     Rate and Rhythm: Normal rate.  Pulmonary:     Effort: Pulmonary effort is normal.  Abdominal:     Palpations: Abdomen is soft.  Genitourinary:   Musculoskeletal:        General: Normal range of motion.     Cervical back: Normal range of motion.  Skin:    General: Skin is warm.  Neurological:     Mental Status: She is alert and oriented to person, place, and time.  Psychiatric:        Mood and Affect: Mood normal.        Behavior: Behavior normal.    SSE: Pooling of clear fluid visualized in vaginal vault, fern repeated and positive ,  Small external erythremic area noted on the left vulva ~ 3 0'clock  <0.1 cm, not vesicular and does not appear to be herpetic  ( Dr Shoshana Dowse made aware of findings and that patient is NOT taking Valtrex as prescribed)   SVE: 2/80/-2  BSUS: Confirms VTX presentation Cat 1, reactive tracing Toco: CTX q 2 -3 minutes  Orders Placed This Encounter  Procedures   Fern Test    Standing Status:   Standing    Number of Occurrences:   1   Fern Test    Standing Status:   Standing     Number of Occurrences:   1      Results for orders placed or performed during the hospital encounter of 02/03/24 (from the past 24 hours)  Fern Test     Status: Normal   Collection Time: 02/03/24 12:07 PM  Result Value Ref Range   POCT Fern Test Negative = intact amniotic membranes   Fern Test     Status: Abnormal   Collection Time: 02/03/24 12:27 PM  Result Value Ref Range   POCT Fern Test Positive = ruptured amniotic membanes     ASSESSMENT/PLAN Medical screening exam complete  Admit to LDR Dr Finis Hugger [facility] Private Attending made aware)  Orders for admission per Dr Finis Hugger  Patient status report given to Dr Finis Hugger and care turned over to her for management in L/D  RN's aware admission to LD  Cherlynn Cornfield, NP 02/03/2024 12:29 PM

## 2024-02-03 NOTE — Anesthesia Preprocedure Evaluation (Signed)
 Anesthesia Evaluation  Patient identified by MRN, date of birth, ID band Patient awake    Reviewed: Allergy & Precautions, Patient's Chart, lab work & pertinent test results  Airway Mallampati: I       Dental no notable dental hx.    Pulmonary former smoker   Pulmonary exam normal        Cardiovascular negative cardio ROS Normal cardiovascular exam     Neuro/Psych  PSYCHIATRIC DISORDERS Anxiety Depression       GI/Hepatic   Endo/Other    Renal/GU      Musculoskeletal   Abdominal   Peds  Hematology   Anesthesia Other Findings   Reproductive/Obstetrics (+) Pregnancy                              Anesthesia Physical Anesthesia Plan  ASA: 2  Anesthesia Plan: Epidural   Post-op Pain Management:    Induction:   PONV Risk Score and Plan: 0  Airway Management Planned: Natural Airway  Additional Equipment: None  Intra-op Plan:   Post-operative Plan:   Informed Consent: I have reviewed the patients History and Physical, chart, labs and discussed the procedure including the risks, benefits and alternatives for the proposed anesthesia with the patient or authorized representative who has indicated his/her understanding and acceptance.       Plan Discussed with:   Anesthesia Plan Comments: (Lab Results      Component                Value               Date                      WBC                      11.1 (H)            02/03/2024                HGB                      12.0                02/03/2024                HCT                      37.4                02/03/2024                MCV                      84.0                02/03/2024                PLT                      290                 02/03/2024           )         Anesthesia Quick Evaluation

## 2024-02-03 NOTE — Anesthesia Procedure Notes (Signed)
 Epidural Patient location during procedure: OB Start time: 02/03/2024 3:54 PM End time: 02/03/2024 3:59 PM  Staffing Anesthesiologist: Willian Harrow, MD Performed: anesthesiologist   Preanesthetic Checklist Completed: patient identified, IV checked, site marked, risks and benefits discussed, surgical consent, monitors and equipment checked, pre-op evaluation and timeout performed  Epidural Patient position: sitting Prep: DuraPrep Patient monitoring: heart rate, continuous pulse ox and blood pressure Approach: midline Location: L3-L4 Injection technique: LOR saline  Needle:  Needle type: Tuohy  Needle gauge: 17 G Needle length: 9 cm Catheter type: closed end flexible Catheter size: 20 Guage Test dose: negative and 1.5% lidocaine  Assessment Events: blood not aspirated, no cerebrospinal fluid, injection not painful, no injection resistance and no paresthesia  Additional Notes LOR @ 4  Patient identified. Risks/Benefits/Options discussed with patient including but not limited to bleeding, infection, nerve damage, paralysis, failed block, incomplete pain control, headache, blood pressure changes, nausea, vomiting, reactions to medications, itching and postpartum back pain. Confirmed with bedside nurse the patient's most recent platelet count. Confirmed with patient that they are not currently taking any anticoagulation, have any bleeding history or any family history of bleeding disorders. Patient expressed understanding and wished to proceed. All questions were answered. Sterile technique was used throughout the entire procedure. Please see nursing notes for vital signs. Test dose was given through epidural catheter and negative prior to continuing to dose epidural or start infusion. Warning signs of high block given to the patient including shortness of breath, tingling/numbness in hands, complete motor block, or any concerning symptoms with instructions to call for help. Patient was  given instructions on fall risk and not to get out of bed. All questions and concerns addressed with instructions to call with any issues or inadequate analgesia.    Reason for block:procedure for pain

## 2024-02-03 NOTE — MAU Note (Signed)
 MAU Triage Note  .Megan Zamora is a 23 y.o. at Unknown here in MAU reporting: cramping since last pm. Contractions q 2 minutes since 8 am. Also reports leaking fluid since 8 am. Reports positive fetal movement.   Onset of complaint: 8 am Pain score: 4/10 There were no vitals filed for this visit.   FHT: 118  Lab orders placed from triage: labor eval

## 2024-02-03 NOTE — Progress Notes (Signed)
 No c/o. Feeling some pressure.  8-9/c/+1

## 2024-02-03 NOTE — H&P (Signed)
 Megan Zamora is a 23 y.o. adult presenting for contractions and LOF. 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 She has a furuncle that has been present for a few days. Declined cfDNA but had normal AFP.   OB History     Gravida  1   Para      Term      Preterm      AB      Living         SAB      IAB      Ectopic      Multiple      Live Births             Past Medical History:  Diagnosis Date   Anxiety    Depression    Past Surgical History:  Procedure Laterality Date   TONSILLECTOMY     WISDOM TOOTH EXTRACTION     Family History: family history includes Alcohol abuse in her father; Anxiety disorder in her mother; Bipolar disorder in her mother; Healthy in her sister; Heart disease in her maternal grandfather; Hypertension in her maternal grandmother; Leukemia in her maternal grandfather; Myelodysplastic syndrome in her paternal grandmother; Sleep apnea in her father and maternal grandmother. Social History:  reports that she has quit smoking. Her smoking use included e-cigarettes. She has never used smokeless tobacco. She reports that she does not currently use alcohol after a past usage of about 1.0 standard drink of alcohol per week. She reports that she does not currently use drugs after having used the following drugs: Marijuana. Frequency: 7.00 times per week.     Maternal Diabetes: No Genetic Screening: Normal Maternal Ultrasounds/Referrals: Normal Fetal Ultrasounds or other Referrals:  None Maternal Substance Abuse:  No Significant Maternal Medications:  None Significant Maternal Lab Results:  None and Group B Strep negative Number of Prenatal Visits:greater than 3 verified prenatal visits   Review of Systems History Dilation: 2 Effacement (%): 80 Station: -2 Exam by:: Cooleen,NP Blood pressure 135/84, pulse 88, temperature 98.1 F (36.7 C), temperature source Oral, resp. rate 17, height 5\' 5"  (1.651 m), weight 82.4 kg, last menstrual period  02/27/2023, SpO2 100%. Exam Physical Exam  NAD, A&O NWOB Abd soft, nondistended, gravid 2mm Small raised lesion - no ulceration, no pus, no drainage, no abrasion, no blistered area. Most c/w furuncle.   Prenatal labs: ABO, Rh:   Antibody:   Rubella:   RPR:    HBsAg:    HIV:    GBS:     Assessment/Plan: 23 yo with labor and SROM.  Contractions mildly painful. 2cm.  Has known lesion most c/w furuncle - does not appear to be HSV outbreak. It was seen on exam 5 days ago in office and was deemed to be a furuncle at that time. Pt never picked up valtrex. Risks of HSV at time of delivery d/w pt. D/w pt also a C section would prevent this. She declines CS.  Labor augmentation discussed - pt declines at this time.  GBS negative.    Megan Zamora 02/03/2024, 12:33 PM

## 2024-02-04 ENCOUNTER — Encounter (HOSPITAL_COMMUNITY): Payer: Self-pay | Admitting: Obstetrics and Gynecology

## 2024-02-04 LAB — CBC
HCT: 28.7 % — ABNORMAL LOW (ref 36.0–46.0)
Hemoglobin: 9.3 g/dL — ABNORMAL LOW (ref 12.0–15.0)
MCH: 26.7 pg (ref 26.0–34.0)
MCHC: 32.4 g/dL (ref 30.0–36.0)
MCV: 82.5 fL (ref 80.0–100.0)
Platelets: 245 10*3/uL (ref 150–400)
RBC: 3.48 MIL/uL — ABNORMAL LOW (ref 3.87–5.11)
RDW: 14.5 % (ref 11.5–15.5)
WBC: 15.9 10*3/uL — ABNORMAL HIGH (ref 4.0–10.5)
nRBC: 0 % (ref 0.0–0.2)

## 2024-02-04 LAB — RPR: RPR Ser Ql: NONREACTIVE

## 2024-02-04 MED ORDER — SENNOSIDES-DOCUSATE SODIUM 8.6-50 MG PO TABS
2.0000 | ORAL_TABLET | ORAL | Status: DC
  Administered 2024-02-04: 2 via ORAL
  Filled 2024-02-04: qty 2

## 2024-02-04 MED ORDER — TETANUS-DIPHTH-ACELL PERTUSSIS 5-2.5-18.5 LF-MCG/0.5 IM SUSY: 0.5000 mL | PREFILLED_SYRINGE | Freq: Once | INTRAMUSCULAR | Status: DC

## 2024-02-04 MED ORDER — OXYCODONE HCL 5 MG PO TABS
10.0000 mg | ORAL_TABLET | ORAL | Status: DC | PRN
  Administered 2024-02-04: 10 mg via ORAL
  Filled 2024-02-04: qty 2

## 2024-02-04 MED ORDER — SIMETHICONE 80 MG PO CHEW: 80.0000 mg | CHEWABLE_TABLET | ORAL | Status: DC | PRN

## 2024-02-04 MED ORDER — WITCH HAZEL-GLYCERIN EX PADS: 1.0000 | MEDICATED_PAD | CUTANEOUS | Status: DC | PRN

## 2024-02-04 MED ORDER — IBUPROFEN 600 MG PO TABS
600.0000 mg | ORAL_TABLET | Freq: Four times a day (QID) | ORAL | Status: DC
  Administered 2024-02-04 – 2024-02-05 (×6): 600 mg via ORAL
  Filled 2024-02-04 (×6): qty 1

## 2024-02-04 MED ORDER — ONDANSETRON HCL 4 MG/2ML IJ SOLN: 4.0000 mg | INTRAMUSCULAR | Status: DC | PRN

## 2024-02-04 MED ORDER — ZOLPIDEM TARTRATE 5 MG PO TABS: 5.0000 mg | ORAL_TABLET | Freq: Every evening | ORAL | Status: DC | PRN

## 2024-02-04 MED ORDER — DIBUCAINE (PERIANAL) 1 % EX OINT: 1.0000 | TOPICAL_OINTMENT | CUTANEOUS | Status: DC | PRN

## 2024-02-04 MED ORDER — PRENATAL MULTIVITAMIN CH
1.0000 | ORAL_TABLET | Freq: Every day | ORAL | Status: DC
  Administered 2024-02-04: 1 via ORAL
  Filled 2024-02-04: qty 1

## 2024-02-04 MED ORDER — ACETAMINOPHEN 325 MG PO TABS
650.0000 mg | ORAL_TABLET | ORAL | Status: DC | PRN
  Administered 2024-02-04 (×2): 650 mg via ORAL
  Filled 2024-02-04 (×2): qty 2

## 2024-02-04 MED ORDER — BENZOCAINE-MENTHOL 20-0.5 % EX AERO
1.0000 | INHALATION_SPRAY | CUTANEOUS | Status: DC | PRN
  Administered 2024-02-04: 1 via TOPICAL
  Filled 2024-02-04: qty 56

## 2024-02-04 MED ORDER — ONDANSETRON HCL 4 MG PO TABS: 4.0000 mg | ORAL_TABLET | ORAL | Status: DC | PRN

## 2024-02-04 MED ORDER — DIPHENHYDRAMINE HCL 25 MG PO CAPS: 25.0000 mg | ORAL_CAPSULE | Freq: Four times a day (QID) | ORAL | Status: DC | PRN

## 2024-02-04 MED ORDER — OXYCODONE HCL 5 MG PO TABS
5.0000 mg | ORAL_TABLET | ORAL | Status: DC | PRN
  Administered 2024-02-04: 5 mg via ORAL
  Filled 2024-02-04: qty 1

## 2024-02-04 MED ORDER — COCONUT OIL OIL: 1.0000 | TOPICAL_OIL | Status: DC | PRN

## 2024-02-04 NOTE — Lactation Note (Signed)
 This note was copied from a baby's chart. Lactation Consultation Note  Patient Name: Boy Aleha Larimer [id number] Date: 02/04/2024 Age:23 hours Reason for consult: Follow-up assessment, Term, First time breastfeeding. RN request  P1, RN stated mother would like assistance with latching. Baby latched after birth and then has been sleepy so mother started supplemented with formula.  Baby consumed 4 ml and has been sleepy since. Placed baby in crib to wake for feeding.   Mother states her breasts are sensitive.  Did not hand express. Assisted latching in side lying position.  Baby remains sleepy at the breast. Mother very uncomfortable and appears exhausted. Suggest mother attempt latching again in 1.5 hours. Calll for help with latching as needed.  Maternal Data Has patient been taught Hand Expression?: Yes  Feeding Mother's Current Feeding Choice: Breast Milk and Formula   Interventions Interventions: Breast feeding basics reviewed;Assisted with latch;Skin to skin;Education  Discharge    Consult Status Consult Status: Follow-up Date: 02/05/24 Follow-up type: In-patient    Vicenta Graft [facility] 02/04/2024, 11:28 AM

## 2024-02-04 NOTE — Progress Notes (Signed)
 Post Partum Day 1 Subjective: no complaints, up ad lib, voiding, and tolerating PO  Objective: Blood pressure (!) 109/53, pulse 66, temperature 98.4 F (36.9 C), temperature source Oral, resp. rate 16, height 5\' 5"  (1.651 m), weight 82.4 kg, last menstrual period 02/27/2023, SpO2 100%, unknown if currently breastfeeding.  Physical Exam:  General: alert, cooperative, and appears stated age Lochia: appropriate Uterine Fundus: firm Incision: healing well, no significant drainage DVT Evaluation: No evidence of DVT seen on physical exam. Negative Homan's sign. No cords or calf tenderness.  Recent Labs    02/03/24 1304 02/04/24 0508  HGB 12.0 9.3*  HCT 37.4 28.7*    Assessment/Plan: Plan for discharge tomorrow   LOS: 1 day   Dyanna Glasgow, DO 02/04/2024, 12:35 PM

## 2024-02-04 NOTE — Lactation Note (Signed)
 This note was copied from a baby's chart. Lactation Consultation Note  Patient Name: Megan Zamora [id number] Date: 02/04/2024 Age:23 hours   Per RN [name]) , [facility] declined [facility] services in L&D but would like to be seen on MBU.   Maternal Data    Feeding    LATCH Score                    Lactation Tools Discussed/Used    Interventions    Discharge    Consult Status      [name] 02/04/2024, 1:04 AM
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
## 2024-02-04 NOTE — Lactation Note (Signed)
 This note was copied from a baby's chart. Lactation Consultation Note  Patient Name: Megan Zamora [id number] Date: 02/04/2024 Age:23 hours Reason for consult: Initial assessment;1st time breastfeeding;Term Per MOB, infant did not latch in L&D. MOB latched infant on her right breast using the football hold position with pillow support, infant was on and off the breast for 15 minutes. Infant appeared content after the feeding. LC discussed signs of hunger cues as well as signs of satiety after a feeding. MOB will continue to breastfeed infant by cues, on demand, every 2-3 hours skin to skin. MOB knows to ask for further latch assistance if needed. LC discussed infant's input and output. The importance of maternal meals, rest and hydration. MOB was made aware of O/P services, breastfeeding support groups, community resources, and our phone # for post-discharge questions.    Maternal Data Has patient been taught Hand Expression?: Yes Does the patient have breastfeeding experience prior to this delivery?: No  Feeding Mother's Current Feeding Choice: Breast Milk  LATCH Score Latch: Repeated attempts needed to sustain latch, nipple held in mouth throughout feeding, stimulation needed to elicit sucking reflex.  Audible Swallowing: A few with stimulation  Type of Nipple: Everted at rest and after stimulation  Comfort (Breast/Nipple): Soft / non-tender  Hold (Positioning): Full assist, staff holds infant at breast  LATCH Score: 6   Lactation Tools Discussed/Used    Interventions Interventions: Breast feeding basics reviewed;Assisted with latch;Skin to skin;Breast compression;Adjust position;Support pillows;Position options;Hand express;Education;LC Services brochure  Discharge    Consult Status Consult Status: Follow-up Date: 02/05/24 Follow-up type: In-patient    [name] 02/04/2024, 2:58 AM

## 2024-02-04 NOTE — Anesthesia Postprocedure Evaluation (Signed)
 Anesthesia Post Note  Patient: Megan Zamora  Procedure(s) Performed: AN AD HOC LABOR EPIDURAL     Patient location during evaluation: Mother Baby Anesthesia Type: Epidural Level of consciousness: awake and alert Pain management: pain level controlled Vital Signs Assessment: post-procedure vital signs reviewed and stable Respiratory status: spontaneous breathing, nonlabored ventilation and respiratory function stable Cardiovascular status: stable Postop Assessment: no headache, no backache and epidural receding Anesthetic complications: no   No notable events documented.  Last Vitals:  Vitals:   02/04/24 0333 02/04/24 0725  BP: (!) 110/56 (!) 109/53  Pulse: 80 66  Resp: 18 16  Temp: 37.2 C 36.9 C  SpO2: 100%     Last Pain:  Vitals:   02/04/24 0725  TempSrc: Oral  PainSc:    Pain Goal:                   Megan Zamora

## 2024-02-04 NOTE — Lactation Note (Signed)
 This note was copied from a baby's chart. Lactation Consultation Note  Patient Name: Megan Zamora [id number] Date: 02/04/2024 Age:23 hours Reason for consult: Follow-up assessment;1st time breastfeeding;Term MOB requested Latch assistance.  CO: infant having a lot of episodes of emesis, is poor feeder at the  breast and  with the bottle at this time.  LC observed  when using a slow flow  nipple bottle ( yellow) infant does a lot of tongue thrusting, gaging, has an uncoordinated suck , milk was seeping out the sides of his mouth when using the bottle. LC informed RN to place a SLP consult. LC gave MOB White Nfant nipple infant consumed a  small amount of formula but his suckle was still uncoordinated with using the bottle nipple but much better than the yellow slow flow.  MOB briefly latched infant for 4 minutes on her left breast using the cross cradle hold position with pillow support. Infant did sustain his latch at the breast but became tired. Afterwards LC assisted with bottle feeding and observe infant did not tolerate the yellow slow flow bottle nipple nor the White Nfant nipple well see above concerns. Infant has combination feeding of spoon feeding and bottle feeding with White Nfant nipple and consumed 8 mls. MOB informed LC infant went 5 hours without feeding, attempts have been made.   Due to MOB sensitive breast hand expression was not done, MOB prefers to wait 24 hours, before using DEBP if infant feedings does not improve.   Today's Current Feeding Plan: 1- Continue to latch infant 1st every feeding by cues, on demand , 8+ times within 24 hours, skin to skin. 2- Continue to ask for latch assistance. 3- MOB plans to continue to supplement infant with formula until he is feeding better at the breast. MOB has handout " Feeding Guidelines" 4- Need be seen by SLP for feeding evaluation  Maternal Data    Feeding Mother's Current Feeding Choice: Breast Milk  LATCH Score Latch: Grasps  breast easily, tongue down, lips flanged, rhythmical sucking.  Audible Swallowing: A few with stimulation  Type of Nipple: Everted at rest and after stimulation  Comfort (Breast/Nipple): Soft / non-tender (MOB has sentive breast with areola edema)  Hold (Positioning): Assistance needed to correctly position infant at breast and maintain latch.  LATCH Score: 8   Lactation Tools Discussed/Used    Interventions Interventions: Assisted with latch;Skin to skin;Breast compression;Adjust position;Support pillows;Position options;Education;Pace feeding  Discharge    Consult Status Consult Status: Follow-up Date: 02/05/24 Follow-up type: In-patient    [name] 02/04/2024, 4:22 PM

## 2024-02-05 ENCOUNTER — Ambulatory Visit: Admitting: Physician Assistant

## 2024-02-05 MED ORDER — IBUPROFEN 600 MG PO TABS: 600.0000 mg | ORAL_TABLET | Freq: Four times a day (QID) | ORAL | 0 refills | Status: AC

## 2024-02-05 MED ORDER — ACETAMINOPHEN 325 MG PO TABS: 650.0000 mg | ORAL_TABLET | ORAL | 1 refills | Status: AC | PRN

## 2024-02-05 MED ORDER — SENNOSIDES-DOCUSATE SODIUM 8.6-50 MG PO TABS: 2.0000 | ORAL_TABLET | ORAL | 1 refills | Status: AC

## 2024-02-05 NOTE — Lactation Note (Signed)
 This note was copied from a baby's chart. Lactation Consultation Note  Patient Name: Boy Jazmari Collie [id number] Date: 02/05/2024 Age:23 hours - Post circ  Reason for consult: Follow-up assessment;Infant weight loss;Nipple pain/trauma;Primapara;1st time breastfeeding;Term;Breastfeeding assistance;Difficult latch (3 % weigjt loss,) LC offered to assist with latch and mom receptive.  LC noted areola edema , and recommended steps for latching.  Baby latched on and off , and then was latched and not sucking. Baby has had several bottles and LC suspects the baby is expecting the quick flow. Ended the feeding pace feeding 30 ml .  LC recommended due the milk not being in yet , feed with cues and by 3 hours , steps 1st and then offer the breast, if the baby latches feed 20 mins , supplement at least 30 ml and post pump both breast 15 mins, The next feeding switch to the other breast.  Shells while awake until the nipple / areola complex is more compressible and not tender.  LC reviewed prevention and tx of engorgement and LC resources.  LC offered to request and LC O/P ( see below )   Maternal Data Has patient been taught Hand Expression?: Yes  Feeding Mother's Current Feeding Choice: Breast Milk and Formula Nipple Type: Slow - flow (switched the nipple back to the slow flow)  LATCH Score Latch: Repeated attempts needed to sustain latch, nipple held in mouth throughout feeding, stimulation needed to elicit sucking reflex.  Audible Swallowing: None  Type of Nipple: Everted at rest and after stimulation (areola edema causing the nipple to shorten)  Comfort (Breast/Nipple): Filling, red/small blisters or bruises, mild/mod discomfort  Hold (Positioning): Assistance needed to correctly position infant at breast and maintain latch.  LATCH Score: 5   Lactation Tools Discussed/Used Tools: Shells;Pump;Flanges Flange Size: 18;21 Breast pump type: Manual Pump Education: Milk Storage;Setup, frequency,  and cleaning Reason for Pumping: LC recommended due to areola edema - and tenderness - prior to every feeding until soreness improves - Hand express, prepump with the hand pump and reverse pressure . Shells between feedings while awake  Interventions Interventions: Breast feeding basics reviewed;Assisted with latch;Skin to skin;Breast massage;Hand express;Pre-pump if needed;Reverse pressure;Breast compression;Adjust position;Support pillows;Position options;Shells;Hand pump;Education;LC Services brochure;CDC milk storage guidelines;CDC Guidelines for Breast Pump Cleaning  Discharge Discharge Education: Engorgement and breast care;Warning signs for feeding baby;Outpatient recommendation;Outpatient Epic message sent;Other (comment) (LC offered to request an LC O/P appt., mom receptive and aware they will call her. She also plans to check with Peds group) Pump: Personal;Manual;Hands Free  Consult Status Consult Status: Complete Date: 02/05/24    Richarda Chance 02/05/2024, 1:09 PM

## 2024-02-05 NOTE — Discharge Summary (Signed)
 Postpartum Discharge Summary  Date of Service updated 02/05/2024     Patient Name: Megan Zamora DOB: [birth year] MRN: [id number]  Date of admission: 02/03/2024 Delivery date:02/03/2024 Delivering provider: [name] Date of discharge: 02/05/2024  Admitting diagnosis: Pregnancy [Z34.90] Intrauterine pregnancy: [redacted]w[redacted]d     Secondary diagnosis:  Principal Problem:   Pregnancy  Additional problems: none    Discharge diagnosis: Term Pregnancy Delivered and Anemia, acute blood loss not clinically significant                                             Post partum procedures:none Augmentation: Pitocin Complications: None  Hospital course: Onset of Labor With Vaginal Delivery      23 y.o. yo G1P1001 at [redacted]w[redacted]d was admitted in Latent Labor on 02/03/2024. Labor course was complicated by none  Membrane Rupture Time/Date: 8:00 AM,02/03/2024  Delivery Method:Vaginal, Vacuum (Extractor) Operative Delivery:Device used:Kiwi Indication: Maternal exhaustion Episiotomy: None Lacerations:  2nd degree;Vaginal Patient had a postpartum course complicated by none.  She is ambulating, tolerating a regular diet, passing flatus, and urinating well. Patient is discharged home in stable condition on 02/05/24.  Newborn Data: Birth date:02/03/2024 Birth time:11:51 PM Gender:Female Living status:Living Apgars:8 ,9  Weight:3970 g  Magnesium Sulfate received: No BMZ received: No Rhophylac:N/A MMR:N/A T-DaP:Given prenatally Transfusion:No Immunizations administered: There is no immunization history for the selected administration types on file for this patient.  Physical exam  Vitals:   02/04/24 1335 02/04/24 2041 02/04/24 2340 02/05/24 0525  BP: 101/64 (!) 82/39 125/85 (!) 97/55  Pulse: 82 91  80  Resp: 16 19  14   Temp: 98 F (36.7 C) 98.6 F (37 C)  97.6 F (36.4 C)  TempSrc: Oral Oral  Oral  SpO2: 99% 100%  99%  Weight:      Height:       General: alert, cooperative, and no  distress Lochia: appropriate Uterine Fundus: firm Incision: N/A DVT Evaluation: No evidence of DVT seen on physical exam. Labs: Lab Results  Component Value Date   WBC 15.9 (H) 02/04/2024   HGB 9.3 (L) 02/04/2024   HCT 28.7 (L) 02/04/2024   MCV 82.5 02/04/2024   PLT 245 02/04/2024      Latest Ref Rng & Units 02/03/2024    1:04 PM  CMP  Glucose 70 - 99 mg/dL 99   BUN 6 - 20 mg/dL 8   Creatinine 6.57 - 8.46 mg/dL 9.62   Sodium 952 - 841 mmol/L 136   Potassium 3.5 - 5.1 mmol/L 3.6   Chloride 98 - 111 mmol/L 104   CO2 22 - 32 mmol/L 19   Calcium 8.9 - 10.3 mg/dL 9.0   Total Protein 6.5 - 8.1 g/dL 6.2   Total Bilirubin 0.0 - 1.2 mg/dL 0.4   Alkaline Phos 38 - 126 U/L 159   AST 15 - 41 U/L 20   ALT 0 - 44 U/L 12    Edinburgh Score:     No data to display            After visit meds:  Allergies as of 02/05/2024   No Known Allergies      Medication List     TAKE these medications    acetaminophen 325 MG tablet Commonly known as: Tylenol Take 2 tablets (650 mg total) by mouth every 4 (four) hours as needed (for pain  scale < 4).   ibuprofen 600 MG tablet Commonly known as: ADVIL Take 1 tablet (600 mg total) by mouth every 6 (six) hours.   multivitamin-prenatal 27-0.8 MG Tabs tablet Take 1 tablet by mouth daily at 12 noon.   senna-docusate 8.6-50 MG tablet Commonly known as: Senokot-S Take 2 tablets by mouth daily.         Discharge home in stable condition Infant Feeding: Bottle and Breast Infant Disposition:home with mother Discharge instruction: per After Visit Summary and Postpartum booklet. Activity: Advance as tolerated. Pelvic rest for 6 weeks.  Diet: routine diet Anticipated Birth Control: Unsure Postpartum Appointment:6 weeks Additional Postpartum F/U: none Future Appointments: Future Appointments  Date Time Provider Department Center  03/23/2024 11:00 AM Verneda Golder, [place] CP-CP None    02/05/2024 Grace Laura, MD

## 2024-02-08 ENCOUNTER — Inpatient Hospital Stay (HOSPITAL_COMMUNITY)

## 2024-02-08 ENCOUNTER — Inpatient Hospital Stay (HOSPITAL_COMMUNITY): Admission: RE | Admit: 2024-02-08 | Source: Home / Self Care | Admitting: Obstetrics and Gynecology

## 2024-02-19 ENCOUNTER — Telehealth (HOSPITAL_COMMUNITY): Payer: Self-pay | Admitting: *Deleted

## 2024-02-19 NOTE — Telephone Encounter (Signed)
 02/19/2024  Name: Megan Zamora MRN: [id number] DOB: 2001-02-25  Reason for Call:  Transition of Care Hospital Discharge Call  Contact Status: Patient Contact Status: Message  Language assistant needed:          Follow-Up Questions:    Dimple Francis Postnatal Depression Scale:  In the Past 7 Days:    PHQ2-9 Depression Scale:     Discharge Follow-up:    Post-discharge interventions: NA  Pearlie Bougie, RN 02/19/2024 11:17

## 2024-03-23 ENCOUNTER — Encounter: Payer: Self-pay | Admitting: Physician Assistant

## 2024-03-23 ENCOUNTER — Telehealth (INDEPENDENT_AMBULATORY_CARE_PROVIDER_SITE_OTHER): Admitting: Physician Assistant

## 2024-03-23 DIAGNOSIS — F319 Bipolar disorder, unspecified: Secondary | ICD-10-CM

## 2024-03-23 DIAGNOSIS — R4584 Anhedonia: Secondary | ICD-10-CM | POA: Diagnosis not present

## 2024-03-23 MED ORDER — LURASIDONE HCL 20 MG PO TABS
20.0000 mg | ORAL_TABLET | Freq: Every day | ORAL | 1 refills | Status: DC
Start: 2024-03-23 — End: 2024-05-05

## 2024-03-23 NOTE — Progress Notes (Signed)
 [facility]  Patient ID: Megan Zamora,  MRN: [id number]  PCP: Pcp, No  Date of Evaluation: 03/23/2024 Time spent:30 minutes  Chief Complaint:  Chief Complaint   Depression; Follow-up    Virtual Visit via Telehealth  I connected with patient by a video enabled telemedicine application with their informed consent, and verified patient privacy and that I am speaking with the correct person using two identifiers.  I am private, in my office and the patient is at home in [place], [place].  I discussed the limitations, risks, security and privacy concerns of performing an evaluation and management service by video and the availability of in person appointments. I also discussed with the patient that there may be a patient responsible charge related to this service. The patient expressed understanding and agreed to proceed.   I discussed the assessment and treatment plan with the patient. The patient was provided an opportunity to ask questions and all were answered. The patient agreed with the plan and demonstrated an understanding of the instructions.   The patient was advised to call back or seek an in-person evaluation if the symptoms worsen or if the condition fails to improve as anticipated.  I provided 30  minutes of non-face-to-face time during this encounter.  HISTORY/CURRENT STATUS: HPI 10 months overdue for follow-up appointment. Last seen 02/04/2023 w/ NS 04/18/2023 and 01/14/2024. Is holding her son who I can see on camera.   Is 7 weeks post partum.  Has a son, w/ normal vaginal delivery without complications on 02/03/2024. She and son are doing well.   Is breastfeeding.  Wants to get back on Latuda . Took it prior to pregnancy but questionable compliance. Reported SE of 'feeling bad,' was nauseated and dizzy at one point, but felt like the Latuda  was helpful and those sx might have been from something else.   States she doesn't want to do things and is tired. Her son  isn't sleeping through the night and that could be part of the issue. She takes care of the baby and does things around that house that are necessary, but doesn't have much energy for anything else.  No extreme sadness, tearfulness, or feelings of hopelessness.  Doesn't cry easily.  ADLs and personal hygiene are normal.  No memory deficit.  Appetite has not changed.  Weight is stable.  No PA, does feel overwhelmed at times but it's situational.  No mania, psychosis, or delirium.  Denies suicidal or homicidal thoughts.  Review of Systems  Constitutional:  Positive for malaise/fatigue.  HENT: Negative.    Eyes: Negative.   Respiratory: Negative.    Cardiovascular: Negative.   Gastrointestinal: Negative.   Genitourinary: Negative.   Musculoskeletal: Negative.   Skin: Negative.   Neurological: Negative.   Endo/Heme/Allergies: Negative.   Psychiatric/Behavioral:         See HPI   Individual Medical History/ Review of Systems: Changes? :No   Past medications for mental health diagnoses include: Lexapro   [facility]   No Suicide attempts, H/O cutting, last time was 07/2022.  Has not had to seek medical attention for any of the lacerations.   Sees Heron Collier since she was 23 years old  Allergies: Patient has no known allergies.  Current Medications:  Current Outpatient Medications:    lurasidone  (LATUDA ) 20 MG TABS tablet, Take 1 tablet (20 mg total) by mouth daily with breakfast., Disp: 30 tablet, Rfl: 1   Prenatal Vit-Fe Fumarate-FA (MULTIVITAMIN-PRENATAL) 27-0.8  MG TABS tablet, Take 1 tablet by mouth daily at 12 noon., Disp: , Rfl:    acetaminophen  (TYLENOL ) 325 MG tablet, Take 2 tablets (650 mg total) by mouth every 4 (four) hours as needed (for pain scale < 4). (Patient not taking: Reported on 03/23/2024), Disp: 30 tablet, Rfl: 1   ibuprofen  (ADVIL ) 600 MG tablet, Take 1 tablet (600 mg total) by mouth every 6 (six) hours. (Patient not taking:  Reported on 03/23/2024), Disp: 30 tablet, Rfl: 0   senna-docusate (SENOKOT-S) 8.6-50 MG tablet, Take 2 tablets by mouth daily. (Patient not taking: Reported on 03/23/2024), Disp: 30 tablet, Rfl: 1 Medication Side Effects: none  Family Medical/ Social History: Changes? No  MENTAL HEALTH EXAM:  Last menstrual period 02/27/2023, currently breastfeeding.There is no height or weight on file to calculate BMI.  General Appearance: Casual and Well Groomed  Eye Contact:  Good  Speech:  Clear and Coherent and Normal Rate  Volume:  Normal  Mood:  Euthymic  Affect:  Congruent  Thought Process:  Goal Directed and Descriptions of Associations: Circumstantial  Orientation:  Full (Time, Place, and Person)  Thought Content: Logical   Suicidal Thoughts:  No  Homicidal Thoughts:  No  Memory:  WNL  Judgement:  Good  Insight:  Good  Psychomotor Activity:  Normal  Concentration:  Concentration: Good and Attention Span: Good  Recall:  Good  Fund of Knowledge: Good  Language: Good  Assets:  Communication Skills Desire for Improvement Financial Resources/Insurance Housing Transportation Vocational/Educational  ADL's:  Intact  Cognition: WNL  Prognosis:  Good   Reviewed discharge summary and labs from 02/03/2024   DIAGNOSES:    ICD-10-CM   1. Bipolar affective disorder, remission status unspecified (HCC)  F31.9     2. Anhedonia  R45.84       Receiving Psychotherapy: Yes with Heron Collier.  RECOMMENDATIONS:  PDMP reviewed.  No controlled substances. I provided approximately 30  minutes of non-face-to-face time during this encounter, including time spent before and after the visit in records review, medical decision making, counseling pertinent to today's visit, and charting.   Discussed the depression. She is not having SI/HI, this episode is no worse than in the past but she knows to go to ER, [facility], call [phone] or [phone] if she gets worse at any point. I recommend restarting Latuda . There have  been different reports in the past from her with efficacy and/or SE, it seems the pros outweigh the cons at this point and is a better option than an SSRI, which may bring on a manic episode. She wants to restart it.   Restart Latuda  20 mg, 1 p.o. q. evening with supper. Continue B complex.  Recommend vitamin D and multivitamin as well. Continue therapy with Barbara Vaughn. Return in 6 weeks.  Verneita Cooks, PA-C

## 2024-05-05 ENCOUNTER — Ambulatory Visit (INDEPENDENT_AMBULATORY_CARE_PROVIDER_SITE_OTHER): Admitting: Physician Assistant

## 2024-05-05 ENCOUNTER — Encounter: Payer: Self-pay | Admitting: Physician Assistant

## 2024-05-05 DIAGNOSIS — F319 Bipolar disorder, unspecified: Secondary | ICD-10-CM

## 2024-05-05 MED ORDER — LURASIDONE HCL 20 MG PO TABS
20.0000 mg | ORAL_TABLET | Freq: Every day | ORAL | 1 refills | Status: DC
Start: 2024-05-05 — End: 2024-06-01

## 2024-05-05 NOTE — Progress Notes (Signed)
 [facility]  Patient ID: Danise Dehne,  MRN: [id number]  PCP: Pcp, No  Date of Evaluation: 05/05/2024 Time spent:20 minutes  Chief Complaint:  Chief Complaint   Follow-up    Louella is doing well. Latuda  was restarted in June.  It's effective. She is able to enjoy things. She's tired a lot but works 3 jobs and has a 28 month old son.   Quality/quantity of sleep varies.  No extreme sadness, tearfulness, or feelings of hopelessness.  ADLs and personal hygiene are normal.   Denies any changes in concentration, making decisions, or remembering things.  Appetite has not changed.  Weight is stable.   No mania, delirium, AH/VH.  No SI/HI.  Individual Medical History/ Review of Systems: Changes? :No   Past medications for mental health diagnoses include: Lexapro   [facility]   No Suicide attempts, H/O cutting, last time was 07/2022.  Has not had to seek medical attention for any of the lacerations.   Sees Heron Collier since she was 23 years old  Allergies: Patient has no known allergies.  Current Medications:  Current Outpatient Medications:    valACYclovir (VALTREX) 500 MG tablet, Take 500 mg by mouth 2 (two) times daily., Disp: , Rfl:    acetaminophen  (TYLENOL ) 325 MG tablet, Take 2 tablets (650 mg total) by mouth every 4 (four) hours as needed (for pain scale < 4). (Patient not taking: Reported on 03/23/2024), Disp: 30 tablet, Rfl: 1   ibuprofen  (ADVIL ) 600 MG tablet, Take 1 tablet (600 mg total) by mouth every 6 (six) hours. (Patient not taking: Reported on 03/23/2024), Disp: 30 tablet, Rfl: 0   lurasidone  (LATUDA ) 20 MG TABS tablet, Take 1 tablet (20 mg total) by mouth daily with breakfast., Disp: 90 tablet, Rfl: 1   Prenatal Vit-Fe Fumarate-FA (MULTIVITAMIN-PRENATAL) 27-0.8 MG TABS tablet, Take 1 tablet by mouth daily at 12 noon., Disp: , Rfl:    senna-docusate (SENOKOT-S) 8.6-50 MG tablet, Take 2 tablets by mouth daily. (Patient  not taking: Reported on 03/23/2024), Disp: 30 tablet, Rfl: 1 Medication Side Effects: none  Family Medical/ Social History: Changes? No  MENTAL HEALTH EXAM:  currently breastfeeding.There is no height or weight on file to calculate BMI.  General Appearance: Casual and Well Groomed  Eye Contact:  Good  Speech:  Clear and Coherent and Normal Rate  Volume:  Normal  Mood:  Euthymic  Affect:  Congruent  Thought Process:  Goal Directed and Descriptions of Associations: Circumstantial  Orientation:  Full (Time, Place, and Person)  Thought Content: Logical   Suicidal Thoughts:  No  Homicidal Thoughts:  No  Memory:  WNL  Judgement:  Good  Insight:  Good  Psychomotor Activity:  Normal  Concentration:  Concentration: Good and Attention Span: Good  Recall:  Good  Fund of Knowledge: Good  Language: Good  Assets:  Communication Skills Desire for Improvement Financial Resources/Insurance Housing Physical Health Transportation Vocational/Educational  ADL's:  Intact  Cognition: WNL  Prognosis:  Good    DIAGNOSES:    ICD-10-CM   1. Bipolar I disorder (HCC)  F31.9      Receiving Psychotherapy: Yes with Heron Collier.  RECOMMENDATIONS:  PDMP reviewed.  No controlled substances. I provided approximately  20 minutes of face to face time during this encounter, including time spent before and after the visit in records review, medical decision making, counseling pertinent to today's visit, and charting.   She is doing well on the current  treatment so no changes are needed.   Continue  Latuda  20 mg, 1 p.o. q. evening with supper. Continue B complex.  Recommend vitamin D and multivitamin as well. Continue therapy with Barbara Vaughn. Return in 6 months.  Verneita Cooks, PA-C

## 2024-05-10 ENCOUNTER — Encounter: Payer: Self-pay | Admitting: Physician Assistant

## 2024-05-29 ENCOUNTER — Other Ambulatory Visit: Payer: Self-pay | Admitting: Physician Assistant

## 2024-11-05 ENCOUNTER — Ambulatory Visit: Admitting: Physician Assistant
